# Patient Record
Sex: Female | Born: 1995 | Race: White | Hispanic: No | Marital: Married | Smoking: Former smoker
Health system: Southern US, Community
[De-identification: ages and names within clinical notes are randomized; demographics above are authoritative.]

## PROBLEM LIST (undated history)

## (undated) DIAGNOSIS — Z91018 Allergy to other foods: Secondary | ICD-10-CM

## (undated) HISTORY — PX: CYST REMOVAL HAND: SHX6279

---

## 2019-06-30 ENCOUNTER — Other Ambulatory Visit: Payer: Self-pay

## 2019-06-30 ENCOUNTER — Encounter (HOSPITAL_BASED_OUTPATIENT_CLINIC_OR_DEPARTMENT_OTHER): Payer: Self-pay | Admitting: Emergency Medicine

## 2019-06-30 ENCOUNTER — Emergency Department (HOSPITAL_BASED_OUTPATIENT_CLINIC_OR_DEPARTMENT_OTHER): Payer: BC Managed Care – PPO

## 2019-06-30 ENCOUNTER — Emergency Department (HOSPITAL_BASED_OUTPATIENT_CLINIC_OR_DEPARTMENT_OTHER)
Admission: EM | Admit: 2019-06-30 | Discharge: 2019-06-30 | Disposition: A | Discharge status: Home / Self Care | Payer: BC Managed Care – PPO | Attending: Emergency Medicine | Admitting: Emergency Medicine

## 2019-06-30 DIAGNOSIS — M546 Pain in thoracic spine: Secondary | ICD-10-CM | POA: Diagnosis not present

## 2019-06-30 LAB — D-DIMER, QUANTITATIVE: D-Dimer, Quant: <0.27 ug/mL-FEU (ref 0.00–0.50)

## 2019-06-30 LAB — BASIC METABOLIC PANEL
Anion gap: 8 (ref 5–15)
BUN: 13 mg/dL (ref 6–20)
CO2: 25 mmol/L (ref 22–32)
Calcium: 9.8 mg/dL (ref 8.9–10.3)
Chloride: 104 mmol/L (ref 98–111)
Creatinine, Ser: 0.66 mg/dL (ref 0.44–1.00)
GFR calc Af Amer: >60 mL/min (ref >60)
GFR calc non Af Amer: >60 mL/min (ref >60)
Glucose, Bld: 101 mg/dL — ABNORMAL HIGH (ref 70–99)
Potassium: 4.5 mmol/L (ref 3.5–5.1)
Sodium: 137 mmol/L (ref 135–145)

## 2019-06-30 LAB — HCG, QUANTITATIVE, PREGNANCY: hCG, Beta Chain, Quant, S: <1 m[IU]/mL (ref <5)

## 2019-06-30 MED ORDER — IBUPROFEN 600 MG PO TABS: 600.0000 mg | ORAL_TABLET | Freq: Four times a day (QID) | ORAL | 0 refills | Status: DC | PRN

## 2019-06-30 MED ORDER — IBUPROFEN 400 MG PO TABS
600.0000 mg | ORAL_TABLET | Freq: Once | ORAL | Status: AC
  Administered 2019-06-30: 13:00:00 600 mg via ORAL
  Filled 2019-06-30: qty 1

## 2019-06-30 NOTE — ED Triage Notes (Signed)
Pain between shoulder blades x 30 min

## 2019-06-30 NOTE — ED Notes (Signed)
Patient transported to X-ray 

## 2019-06-30 NOTE — ED Provider Notes (Signed)
[facility] EMERGENCY DEPARTMENT Provider Note   CSN: [id number] Arrival date & time: 06/30/19  1056     History   Chief Complaint Chief Complaint  Patient presents with  . Back Pain    HPI Margaret Jensen is a 23 y.o. female no significant past medical history presents emergency department with back pain.  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  She says she has never had pain like this before.  She reports this pain began approximately 40 minutes prior to arrival in the ED.  Has been constant.  It does not radiate.  She describes it as a 5 out of 10 intensity.  She says it is worse with inspiration.  She also notes it is worse with arm movement.  She denies any pain, nausea, vomiting, lightheadedness.    She does report that she was 4 wheeling in the past 2 days, and wonders whether this may have strained her neck.  She also reports she was lifting heavy weights approximately 2 days ago.  She is otherwise denies any falls or traumas or known accidents.  No hemoptysis or asymmetric LE edema. Patient denies personal or family history of DVT or PE. No recent hormone use (including OCP); travel for >6 hours; prolonged immobilization for greater than 3 days; surgeries or trauma in the last 4 weeks; or malignancy with treatment within 6 months.  She denies any other medical problems.  She denies any known drug allergies.     HPI  History reviewed. No pertinent past medical history.  There are no active problems to display for this patient.   History reviewed. No pertinent surgical history.   OB History   No obstetric history on file.      Home Medications    Prior to Admission medications   Medication Sig Start Date End Date Taking? Authorizing Provider  ibuprofen (ADVIL) 600 MG tablet Take 1 tablet (600 mg total) by  mouth every 6 (six) hours as needed for mild pain or moderate pain. 06/30/19   Terald Sleeper, MD    Family History No family history on file.  Social History Social History   Tobacco Use  . Smoking status: Never Smoker  . Smokeless tobacco: Never Used  Substance Use Topics  . Alcohol use: Yes    Comment: occ  . Drug use: Never     Allergies   Patient has no known allergies.   Review of Systems Review of Systems  Constitutional: Negative for chills and fever.  Eyes: Negative for pain and visual disturbance.  Respiratory: Positive for chest tightness. Negative for cough and shortness of breath.   Cardiovascular: Negative for chest pain and palpitations.  Gastrointestinal: Negative for nausea and vomiting.  Musculoskeletal: Positive for arthralgias, back pain and myalgias. Negative for neck pain and neck stiffness.  Skin: Negative for pallor and rash.  Neurological: Negative for syncope and light-headedness.  Psychiatric/Behavioral: Negative for agitation and confusion.  All other systems reviewed and are negative.    Physical Exam Updated Vital Signs BP 120/74 (BP Location: Right Arm)   Pulse 60   Temp 98.7 F (37.1 C) (Oral)   Resp 16   Ht 5\' 3"  (1.6 m)   Wt 68 kg   LMP 06/14/2019   SpO2 98%   BMI 26.57 kg/m   Physical Exam Vitals signs  and nursing note reviewed.  Constitutional:      General: She is not in acute distress.    Appearance: She is well-developed.  HENT:     Head: Normocephalic and atraumatic.  Eyes:     Conjunctiva/sclera: Conjunctivae normal.  Neck:     Musculoskeletal: Neck supple.  Cardiovascular:     Rate and Rhythm: Regular rhythm.     Pulses: Normal pulses.     Heart sounds: No murmur.     Comments: HR 95-100 bpm Pulmonary:     Effort: Pulmonary effort is normal. No respiratory distress.     Breath sounds: Normal breath sounds.     Comments: 100% on room air Musculoskeletal:        General: No swelling or tenderness.      Comments: Back pain is not reproducible on exam  Skin:    General: Skin is warm and dry.  Neurological:     General: No focal deficit present.     Mental Status: She is alert and oriented to person, place, and time.  Psychiatric:        Mood and Affect: Mood normal.        Behavior: Behavior normal.      ED Treatments / Results  Labs (all labs ordered are listed, but only abnormal results are displayed) Labs Reviewed  BASIC METABOLIC PANEL - Abnormal; Notable for the following components:      Result Value   Glucose, Bld 101 (*)    All other components within normal limits  D-DIMER, QUANTITATIVE (NOT AT [facility])  HCG, QUANTITATIVE, PREGNANCY    EKG None  Radiology Dg Chest 2 View  Result Date: 06/30/2019 CLINICAL DATA:  Pleuritic chest pain. EXAM: CHEST - 2 VIEW COMPARISON:  None. FINDINGS: The heart size and mediastinal contours are within normal limits. Both lungs are clear. No pneumothorax or pleural effusion is noted. The visualized skeletal structures are unremarkable. IMPRESSION: No active cardiopulmonary disease. Electronically Signed   By: Marijo Conception M.D.   On: 06/30/2019 13:23    Procedures Procedures (including critical care time)  Medications Ordered in ED Medications  ibuprofen (ADVIL) tablet 600 mg (600 mg Oral Given 06/30/19 1257)     Initial Impression / Assessment and Plan / ED Course  I have reviewed the triage vital signs and the nursing notes.  Pertinent labs & imaging results that were available during my care of the patient were reviewed by me and considered in my medical decision making (see chart for details).  The patient is a 23 year old female presenting to the emergency department sudden onset pleuritic mid back pain.  This occurred without any prodrome or any clear inciting events.  She is mildly tachycardic here with a heart rate ranging from 95 to just over 100 bpm.  Blood pressure is fine she is satting well on room air.  She reports  that she is extremely anxious and her mother bedside confirms that she gets very anxious around doctors.  She has no other risk factors for PE, however given the borderline tachycardia, given that her symptoms are pleuritic, I discussed obtaining a d-dimer with the patient and her mother.  I get the neck step if this test is positive would be CT angiogram.  They verbalized understanding and wished to proceed with this test.  Otherwise it seems more likely to me that this is a musculoskeletal condition.  It may also be related to cervical or upper thoracic arthropathy or back sprain.  I  have a much lower suspicion for pneumonia, acute coronary syndrome, aortic dissection.  Will obtain a chest x-ray to evaluate for evidence of pneumothorax.  She otherwise appears quite well on exam and comfortable.  Clinical Course as of Jun 29 1812  Fri Jun 30, 2019  1326  FINDINGS: The heart size and mediastinal contours are within normal limits. Both lungs are clear. No pneumothorax or pleural effusion is noted. The visualized skeletal structures are unremarkable.  IMPRESSION: No active cardiopulmonary disease   [MT]    Clinical Course User Index [MT] Terald Sleeperrifan, Kandise Riehle J, MD     Final Clinical Impressions(s) / ED Diagnoses   Final diagnoses:  Acute midline thoracic back pain    ED Discharge Orders         Ordered    ibuprofen (ADVIL) 600 MG tablet  Every 6 hours PRN     06/30/19 1328           Terald Sleeperrifan, Odus Clasby J, MD 06/30/19 1813

## 2019-07-09 ENCOUNTER — Other Ambulatory Visit: Payer: Self-pay

## 2019-07-09 ENCOUNTER — Emergency Department
Admission: EM | Admit: 2019-07-09 | Discharge: 2019-07-09 | Disposition: A | Discharge status: Home / Self Care | Payer: BC Managed Care – PPO | Source: Home / Self Care | Attending: Emergency Medicine | Admitting: Emergency Medicine

## 2019-07-09 ENCOUNTER — Other Ambulatory Visit (HOSPITAL_COMMUNITY)
Admission: RE | Admit: 2019-07-09 | Discharge: 2019-07-09 | Disposition: A | Discharge status: Home / Self Care | Payer: BC Managed Care – PPO | Source: Ambulatory Visit | Attending: Emergency Medicine | Admitting: Emergency Medicine

## 2019-07-09 ENCOUNTER — Encounter: Payer: Self-pay | Admitting: Emergency Medicine

## 2019-07-09 DIAGNOSIS — R300 Dysuria: Secondary | ICD-10-CM

## 2019-07-09 DIAGNOSIS — N898 Other specified noninflammatory disorders of vagina: Secondary | ICD-10-CM | POA: Diagnosis not present

## 2019-07-09 LAB — POCT URINALYSIS DIP (MANUAL ENTRY)
Bilirubin, UA: NEGATIVE
Glucose, UA: NEGATIVE mg/dL
Ketones, POC UA: NEGATIVE mg/dL
Leukocytes, UA: NEGATIVE
Nitrite, UA: NEGATIVE
Protein Ur, POC: NEGATIVE mg/dL
Spec Grav, UA: >=1.03 — AB (ref 1.010–1.025)
Urobilinogen, UA: 0.2 E.U./dL
pH, UA: 7 (ref 5.0–8.0)

## 2019-07-09 LAB — POCT URINE PREGNANCY: Preg Test, Ur: NEGATIVE

## 2019-07-09 NOTE — Discharge Instructions (Signed)
Drink good quantities of fluids. Apply Monistat to the vaginal introitus once a day. Urine culture was done. Discharge sent for evaluation also.  Please check my chart.

## 2019-07-09 NOTE — ED Provider Notes (Addendum)
[facility]    CSN: [id number] Arrival date & time: 07/09/19  0854      History   Chief Complaint Chief Complaint  Patient presents with  . Dysuria    HPI Margaret Jensen is a 23 y.o. female.    Dysuria Associated symptoms: vaginal discharge   Associated symptoms: no flank pain   Patient enters with a chief complaint of 3-day history of itching in the vaginal area associated with some burning in the vaginal area.  She denies any blister formation.  She is denies stable relationship.  And has an IUD for birth control.  She has no history of recurrent urinary tract infections.  She did not get up during the evening to urinate.  History reviewed. No pertinent past medical history.  There are no active problems to display for this patient.   History reviewed. No pertinent surgical history.  OB History   No obstetric history on file.      Home Medications    Prior to Admission medications   Not on File    Family History History reviewed. No pertinent family history.  Social History Social History   Tobacco Use  . Smoking status: Never Smoker  . Smokeless tobacco: Never Used  Substance Use Topics  . Alcohol use: Yes    Comment: occ  . Drug use: Never     Allergies   Patient has no known allergies.   Review of Systems Review of Systems  Constitutional: Negative.   Genitourinary: Positive for dysuria and vaginal discharge. Negative for flank pain, genital sores and urgency.     Physical Exam Triage Vital Signs ED Triage Vitals  Enc Vitals Group     BP 07/09/19 0904 115/71     Pulse Rate 07/09/19 0904 82     Resp --      Temp 07/09/19 0904 98.2 F (36.8 C)     Temp Source 07/09/19 0904 Oral     SpO2 07/09/19 0904 98 %     Weight 07/09/19 0905 165 lb (74.8 kg)     Height 07/09/19 0905 5\' 3"  (1.6 m)     Head Circumference --      Peak Flow --      Pain Score 07/09/19 0915 0     Pain Loc --      Pain Edu? --      Excl. in [place]? --     No data found.  Updated Vital Signs BP 115/71 (BP Location: Left Arm)   Pulse 82   Temp 98.2 F (36.8 C) (Oral)   Ht 5\' 3"  (1.6 m)   Wt 74.8 kg   LMP 06/14/2019 (Approximate)   SpO2 98%   BMI 29.23 kg/m   Visual Acuity Right Eye Distance:   Left Eye Distance:   Bilateral Distance:    Right Eye Near:   Left Eye Near:    Bilateral Near:     Physical Exam Constitutional:      Appearance: Normal appearance.  Pulmonary:     Effort: Pulmonary effort is normal.  Abdominal:     General: Abdomen is flat.     Palpations: Abdomen is soft. There is no mass.     Tenderness: There is no abdominal tenderness. There is no right CVA tenderness or left CVA tenderness.  Neurological:     Mental Status: She is alert.      UC Treatments / Results  Labs (all labs ordered are listed, but only abnormal results are  displayed) Labs Reviewed  POCT URINALYSIS DIP (MANUAL ENTRY) - Abnormal; Notable for the following components:      Result Value   Spec Grav, UA >=1.030 (*)    Blood, UA trace-intact (*)    All other components within normal limits  URINE CULTURE  POCT URINE PREGNANCY  CERVICOVAGINAL ANCILLARY ONLY    EKG   Radiology No results found.  Procedures Procedures (including critical care time)  Medications Ordered in UC Medications - No data to display  Initial Impression / Assessment and Plan / UC Course  I have reviewed the triage vital signs and the nursing notes. Her urine is essentially normal.  We will go ahead and do a pregnancy test to be complete urine culture was done as well as self collect less than sent for BV, trichomonas, and yeast.  She can use some Monistat cream in the interim and she was encouraged to force fluids.  Patient declined to have any STD testing done Pertinent labs & imaging results that were available during my care of the patient were reviewed by me and considered in my medical decision making (see chart for details).      Final  Clinical Impressions(s) / UC Diagnoses   Final diagnoses:  Dysuria  Vaginal discharge     Discharge Instructions     Drink good quantities of fluids. Apply Monistat to the vaginal introitus once a day. Urine culture was done. Discharge sent for evaluation also.  Please check my chart.    ED Prescriptions    None     PDMP not reviewed this encounter.   Collene Gobble, MD 07/09/19 1004    Collene Gobble, MD 07/09/19 1005

## 2019-07-09 NOTE — ED Triage Notes (Signed)
Here with worsening dysuria, freq, urge and itching. Started taking Azo x2 days ago; slight improvement. Denies fever,chills or back pain. Reports vag odor but declined STD testing.

## 2019-07-12 ENCOUNTER — Telehealth: Payer: Self-pay | Admitting: Emergency Medicine

## 2019-07-12 LAB — URINE CULTURE
MICRO NUMBER:: 978467
SPECIMEN QUALITY:: ADEQUATE

## 2019-07-12 LAB — CERVICOVAGINAL ANCILLARY ONLY
Bacterial vaginitis: NEGATIVE
Candida vaginitis: NEGATIVE
Trichomonas: NEGATIVE

## 2019-07-12 NOTE — Telephone Encounter (Signed)
Dr Everlene Farrier called in [place] 100 bid for 3 days, pt was notified

## 2019-08-19 ENCOUNTER — Encounter: Payer: Self-pay | Admitting: Emergency Medicine

## 2019-08-19 ENCOUNTER — Emergency Department
Admission: EM | Admit: 2019-08-19 | Discharge: 2019-08-19 | Disposition: A | Discharge status: Home / Self Care | Payer: BC Managed Care – PPO | Source: Home / Self Care | Attending: Family Medicine | Admitting: Family Medicine

## 2019-08-19 ENCOUNTER — Other Ambulatory Visit: Payer: Self-pay

## 2019-08-19 ENCOUNTER — Emergency Department (INDEPENDENT_AMBULATORY_CARE_PROVIDER_SITE_OTHER): Payer: BC Managed Care – PPO

## 2019-08-19 DIAGNOSIS — M79675 Pain in left toe(s): Secondary | ICD-10-CM

## 2019-08-19 DIAGNOSIS — S90112A Contusion of left great toe without damage to nail, initial encounter: Secondary | ICD-10-CM

## 2019-08-19 NOTE — ED Triage Notes (Signed)
Patient injured left large toe yesterday; bent during work activities; now painful especially upon weight bearing.No OTC today. She has not had influenza vacc this season. She has not been in contact with known covid positive person nor has she travelled during past month,.

## 2019-08-19 NOTE — Discharge Instructions (Addendum)
Apply ice pack for 10 to 15 minutes, 3 to 4 times daily  Continue until pain and swelling decrease.  May take Tylenol or ibuprofen as needed.

## 2019-08-19 NOTE — ED Provider Notes (Signed)
[facility]    CSN: [id number] Arrival date & time: 08/19/19  1352      History   Chief Complaint Chief Complaint  Patient presents with  . Toe Pain    left large    HPI Margaret Jensen is a 23 y.o. female.   Patient tripped over a dog kennel yesterday, injuring her left first toe.  She continues to have pain with weight bearing.   Toe Pain This is a new problem. The current episode started yesterday. The problem occurs constantly. The problem has not changed since onset.The symptoms are aggravated by walking. Nothing relieves the symptoms. She has tried nothing for the symptoms.    History reviewed. No pertinent past medical history.  There are no active problems to display for this patient.   History reviewed. No pertinent surgical history.  OB History   No obstetric history on file.      Home Medications    Prior to Admission medications   Medication Sig Start Date End Date Taking? Authorizing Provider  levonorgestrel (MIRENA) 20 MCG/24HR IUD 1 each by Intrauterine route once.   Yes [provider]    Family History No family history on file.  Social History Social History   Tobacco Use  . Smoking status: Never Smoker  . Smokeless tobacco: Never Used  Substance Use Topics  . Alcohol use: Yes    Comment: occ  . Drug use: Never     Allergies   Patient has no known allergies.   Review of Systems Review of Systems  All other systems reviewed and are negative.    Physical Exam Triage Vital Signs ED Triage Vitals  Enc Vitals Group     BP 08/19/19 1505 115/75     Pulse Rate 08/19/19 1505 71     Resp 08/19/19 1505 16     Temp 08/19/19 1505 98.5 F (36.9 C)     Temp Source 08/19/19 1505 Oral     SpO2 08/19/19 1505 100 %     Weight 08/19/19 1506 163 lb 2.3 oz (74 kg)     Height 08/19/19 1506 5\' 3"  (1.6 m)     Head Circumference --      Peak Flow --      Pain Score 08/19/19 1506 5     Pain Loc --      Pain Edu? --       Excl. in [place]? --    No data found.  Updated Vital Signs BP 115/75 (BP Location: Right Arm)   Pulse 71   Temp 98.5 F (36.9 C) (Oral)   Resp 16   Ht 5\' 3"  (1.6 m)   Wt 74 kg   SpO2 100%   BMI 28.90 kg/m   Visual Acuity Right Eye Distance:   Left Eye Distance:   Bilateral Distance:    Right Eye Near:   Left Eye Near:    Bilateral Near:     Physical Exam Vitals signs and nursing note reviewed.  Constitutional:      General: She is not in acute distress. HENT:     Head: Normocephalic.  Eyes:     Pupils: Pupils are equal, round, and reactive to light.  Cardiovascular:     Rate and Rhythm: Normal rate.  Pulmonary:     Effort: Pulmonary effort is normal.  Musculoskeletal:     Right foot: Decreased range of motion. Normal capillary refill. Tenderness and swelling present. No bony tenderness, crepitus, deformity or laceration.  Feet:     Comments: Left great toe has tenderness and swelling over the plantar surface of the distal phalanx.    Skin:    General: Skin is warm and dry.  Neurological:     Mental Status: She is alert.      UC Treatments / Results  Labs (all labs ordered are listed, but only abnormal results are displayed) Labs Reviewed - No data to display  EKG   Radiology Dg Toe Great Left  Result Date: 08/19/2019 CLINICAL DATA:  Left great toe pain after injury EXAM: LEFT GREAT TOE COMPARISON:  None. FINDINGS: There is no evidence of fracture or dislocation. There is no evidence of arthropathy or other focal bone abnormality. Soft tissues are unremarkable. IMPRESSION: Negative. Electronically Signed   By: Duanne Guess M.D.   On: 08/19/2019 15:24    Procedures Procedures (including critical care time)  Medications Ordered in UC Medications - No data to display  Initial Impression / Assessment and Plan / UC Course  I have reviewed the triage vital signs and the nursing notes.  Pertinent labs & imaging results that were available  during my care of the patient were reviewed by me and considered in my medical decision making (see chart for details).    There is no evidence of fracture. Followup with Family Doctor if not improved in about 10 days.   Final Clinical Impressions(s) / UC Diagnoses   Final diagnoses:  Contusion of left great toe without damage to nail, initial encounter     Discharge Instructions     Apply ice pack for 10 to 15 minutes, 3 to 4 times daily  Continue until pain and swelling decrease.  May take Tylenol or ibuprofen as needed.    ED Prescriptions    None        Lattie Haw, MD 08/21/19 [phone]

## 2019-10-07 ENCOUNTER — Other Ambulatory Visit: Payer: Self-pay

## 2019-10-07 ENCOUNTER — Encounter: Payer: Self-pay | Admitting: Emergency Medicine

## 2019-10-07 ENCOUNTER — Emergency Department
Admission: EM | Admit: 2019-10-07 | Discharge: 2019-10-07 | Disposition: A | Discharge status: Home / Self Care | Payer: BC Managed Care – PPO | Source: Home / Self Care

## 2019-10-07 DIAGNOSIS — R350 Frequency of micturition: Secondary | ICD-10-CM | POA: Diagnosis not present

## 2019-10-07 DIAGNOSIS — N3001 Acute cystitis with hematuria: Secondary | ICD-10-CM

## 2019-10-07 HISTORY — DX: Allergy to other foods: Z91.018

## 2019-10-07 LAB — POCT URINALYSIS DIP (MANUAL ENTRY)
Bilirubin, UA: NEGATIVE
Glucose, UA: NEGATIVE mg/dL
Ketones, POC UA: NEGATIVE mg/dL
Leukocytes, UA: NEGATIVE
Nitrite, UA: POSITIVE — AB
Protein Ur, POC: NEGATIVE mg/dL
Spec Grav, UA: >=1.03 — AB (ref 1.010–1.025)
Urobilinogen, UA: 0.2 E.U./dL
pH, UA: 5.5 (ref 5.0–8.0)

## 2019-10-07 MED ORDER — NITROFURANTOIN MONOHYD MACRO 100 MG PO CAPS: 100.0000 mg | ORAL_CAPSULE | Freq: Two times a day (BID) | ORAL | 0 refills | Status: DC

## 2019-10-07 NOTE — Discharge Instructions (Signed)
Return if symptoms worsen or do not improve. Urine culture pending. If antibiotic require change, you will be notified via phone.

## 2019-10-07 NOTE — ED Provider Notes (Signed)
[facility]    CSN: [id number] Arrival date & time: 10/07/19  1401      History   Chief Complaint Chief Complaint  Patient presents with  . Urinary Frequency    HPI Margaret Jensen is a 24 y.o. female.   HPI   Margaret Jensen is a 24 y.o. female presents for evaluation of urinary frequency, urgency and dysuria x 2 days, without flank pain, fever, chills, nausea/vomiting,  or abnormal vaginal discharge or bleeding. Reports no concern for STD. Previous history of UTI and which respond to Macrobid  Antibiotic therapy. Uncertain of prior urine pathology. No LMP recorded. (Menstrual status: IUD).   Past Medical History:  Diagnosis Date  . Allergy to tree nuts     There are no problems to display for this patient.   History reviewed. No pertinent surgical history.  OB History   No obstetric history on file.      Home Medications    Prior to Admission medications   Medication Sig Start Date End Date Taking? Authorizing Provider  levonorgestrel (MIRENA) 20 MCG/24HR IUD 1 each by Intrauterine route once.    [provider]    Family History No family history on file.  Social History Social History   Tobacco Use  . Smoking status: Never Smoker  . Smokeless tobacco: Never Used  Substance Use Topics  . Alcohol use: Yes    Comment: occ  . Drug use: Never     Allergies   Patient has no known allergies.   Review of Systems Review of Systems Pertinent negatives listed in HPI Physical Exam Triage Vital Signs ED Triage Vitals  Enc Vitals Group     BP 10/07/19 1509 110/68     Pulse Rate 10/07/19 1509 73     Resp 10/07/19 1509 16     Temp 10/07/19 1509 98 F (36.7 C)     Temp Source 10/07/19 1509 Oral     SpO2 10/07/19 1509 100 %     Weight 10/07/19 1510 159 lb (72.1 kg)     Height 10/07/19 1510 5\' 3"  (1.6 m)     Head Circumference --      Peak Flow --      Pain Score 10/07/19 1510 0     Pain Loc --      Pain Edu? --      Excl. in  [place]? --    No data found.  Updated Vital Signs BP 110/68 (BP Location: Right Arm)   Pulse 73   Temp 98 F (36.7 C) (Oral)   Resp 16   Ht 5\' 3"  (1.6 m)   Wt 159 lb (72.1 kg)   SpO2 100%   BMI 28.17 kg/m   Visual Acuity Right Eye Distance:   Left Eye Distance:   Bilateral Distance:    Right Eye Near:   Left Eye Near:    Bilateral Near:    Physical Exam General appearance: alert, well developed, well nourished, cooperative and in no distress Head: Normocephalic, without obvious abnormality, atraumatic Respiratory: Respirations even and unlabored, normal respiratory rate Heart: rate and rhythm normal. No gallop or murmurs noted on exam  Abdomen:-CVA tenderness Extremities: No gross deformities Skin: Skin color, texture, turgor normal. No rashes seen  Psych: Appropriate mood and affect. Neurologic: Alert, oriented to person, place, and time, thought content appropriate.  UC Treatments / Results  Labs (all labs ordered are listed, but only abnormal results are displayed) Labs Reviewed  POCT URINALYSIS DIP (MANUAL  ENTRY) - Abnormal; Notable for the following components:      Result Value   Clarity, UA cloudy (*)    Spec Grav, UA >=1.030 (*)    Blood, UA moderate (*)    Nitrite, UA Positive (*)    All other components within normal limits  URINE CULTURE    EKG   Radiology No results found.  Procedures Procedures (including critical care time)  Medications Ordered in UC Medications - No data to display  Initial Impression / Assessment and Plan / UC Course  I have reviewed the triage vital signs and the nursing notes.  Pertinent labs & imaging results that were available during my care of the patient were reviewed by me and considered in my medical decision making (see chart for details).    Acute urinary tract infection, treating empirically with Macrobid 100 mg BID x 10 day. Red flags discussed. Urine culture pending. Patient verbalized understanding and  agreement with plan. Final Clinical Impressions(s) / UC Diagnoses   Final diagnoses:  Frequency of urination  Acute cystitis with hematuria     Discharge Instructions     Return if symptoms worsen or do not improve. Urine culture pending. If antibiotic require change, you will be notified via phone.    ED Prescriptions    Medication Sig Dispense Auth. Provider   nitrofurantoin, macrocrystal-monohydrate, (MACROBID) 100 MG capsule Take 1 capsule (100 mg total) by mouth 2 (two) times daily. 20 capsule Scot Jun, FNP     PDMP not reviewed this encounter.   Scot Jun, FNP 10/07/19 [phone]

## 2019-10-07 NOTE — ED Triage Notes (Signed)
Patient states she thinks she has a UTI because of the odor, which is typical for her. No OTCs.  Has not had influenza vacc this season. No known contact with covid positive person.

## 2019-10-09 LAB — URINE CULTURE
MICRO NUMBER:: 10026433
SPECIMEN QUALITY:: ADEQUATE

## 2020-01-30 IMAGING — DX DG TOE GREAT 2+V*L*
3 series · 3 of 3 positions shown · non-contrast
Comparison: None.

CLINICAL DATA: Left great toe pain after injury

EXAM:
LEFT GREAT TOE

[toe ap]
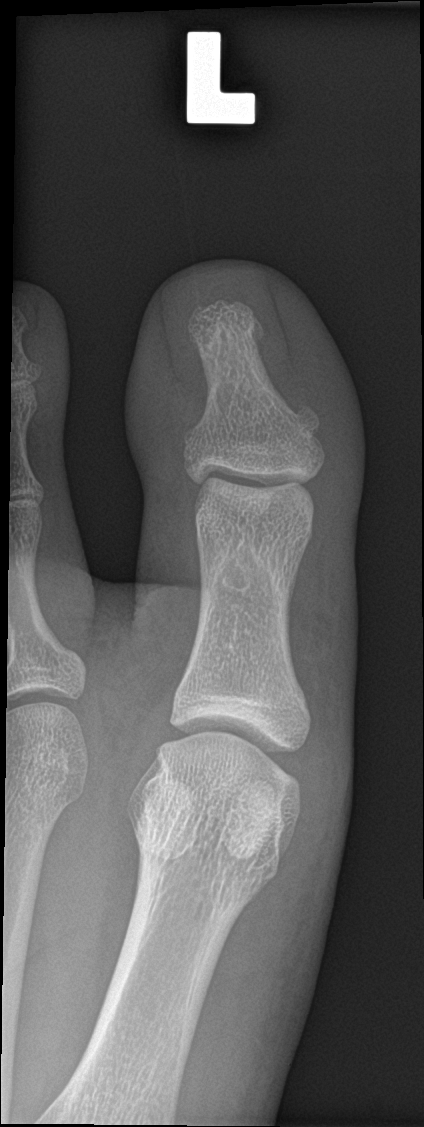

[toe obl]
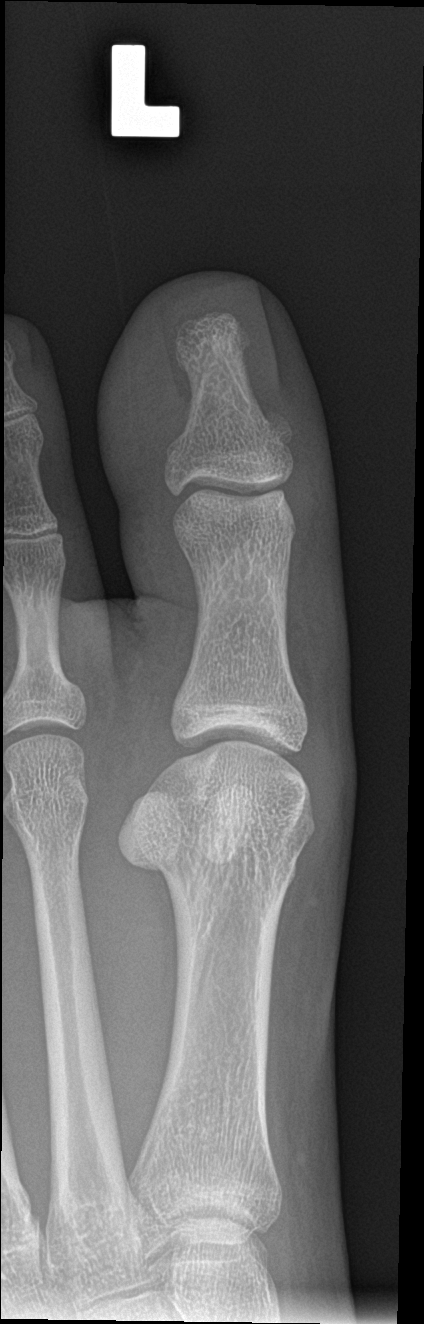

[toe lat]
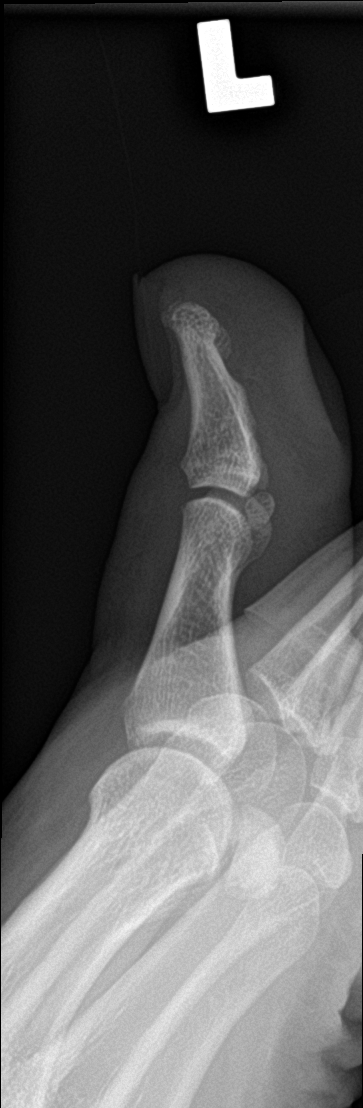

[3 of 3 positions shown; findings below may reference images not displayed]

FINDINGS: There is no evidence of fracture or dislocation. There is no
evidence of arthropathy or other focal bone abnormality. Soft
tissues are unremarkable.
IMPRESSION: Negative.

## 2020-04-13 ENCOUNTER — Emergency Department (INDEPENDENT_AMBULATORY_CARE_PROVIDER_SITE_OTHER)
Admission: RE | Admit: 2020-04-13 | Discharge: 2020-04-13 | Disposition: A | Discharge status: Home / Self Care | Payer: BC Managed Care – PPO | Source: Ambulatory Visit | Attending: Family Medicine | Admitting: Family Medicine

## 2020-04-13 ENCOUNTER — Other Ambulatory Visit: Payer: Self-pay

## 2020-04-13 VITALS — BP 120/77 | HR 81 | Temp 98.3°F | Resp 16 | Ht 62.0 in | Wt 145.0 lb

## 2020-04-13 DIAGNOSIS — Z23 Encounter for immunization: Secondary | ICD-10-CM | POA: Diagnosis not present

## 2020-04-13 DIAGNOSIS — S00262A Insect bite (nonvenomous) of left eyelid and periocular area, initial encounter: Secondary | ICD-10-CM

## 2020-04-13 DIAGNOSIS — W57XXXA Bitten or stung by nonvenomous insect and other nonvenomous arthropods, initial encounter: Secondary | ICD-10-CM | POA: Diagnosis not present

## 2020-04-13 MED ORDER — DOXYCYCLINE HYCLATE 100 MG PO CAPS: ORAL_CAPSULE | ORAL | 0 refills | Status: DC

## 2020-04-13 MED ORDER — TETANUS-DIPHTH-ACELL PERTUSSIS 5-2.5-18.5 LF-MCG/0.5 IM SUSP
0.5000 mL | Freq: Once | INTRAMUSCULAR | Status: AC
  Administered 2020-04-13: 0.5 mL via INTRAMUSCULAR

## 2020-04-13 MED ORDER — METHYLPREDNISOLONE SODIUM SUCC 125 MG IJ SOLR
80.0000 mg | Freq: Once | INTRAMUSCULAR | Status: AC
  Administered 2020-04-13: 80 mg via INTRAMUSCULAR

## 2020-04-13 MED ORDER — PREDNISONE 20 MG PO TABS: ORAL_TABLET | ORAL | 0 refills | Status: DC

## 2020-04-13 NOTE — ED Triage Notes (Signed)
Patient was stung on left ear and right upper eyelid 2 days ago ; has taken benadryl and used ice but now left eyelids very edematous. Has not had covid vaccination.

## 2020-04-13 NOTE — Discharge Instructions (Addendum)
Begin prednisone Sunday 04/14/20. Continue to apply cool compress several times daily.  May take Zyrtec 10mg  daily.

## 2020-04-13 NOTE — ED Provider Notes (Signed)
[facility]    CSN: [id number] Arrival date & time: 04/13/20  1047      History   Chief Complaint Chief Complaint  Patient presents with  . Insect Bite    HPI Margaret Jensen is a 24 y.o. female.   Patient was stung by a wasp on her right ear and left upper eyelid two days ago.  Her left upper eyelid has remained swollen and erythematous, becoming more swollen this morning.  She does not remember her last Tdap.  Has not had covid vaccination.  The history is provided by the patient.    Past Medical History:  Diagnosis Date  . Allergy to tree nuts     There are no problems to display for this patient.   History reviewed. No pertinent surgical history.  OB History   No obstetric history on file.      Home Medications    Prior to Admission medications   Medication Sig Start Date End Date Taking? Authorizing Provider  doxycycline (VIBRAMYCIN) 100 MG capsule Take one cap PO Q12hr with food. 04/13/20   Lattie Haw, MD  levonorgestrel (MIRENA) 20 MCG/24HR IUD 1 each by Intrauterine route once.    [provider]  predniSONE (DELTASONE) 20 MG tablet Take one tab by mouth twice daily for 4 days, then one daily for 3 days. Take with food. 04/13/20   Lattie Haw, MD    Family History No family history on file.  Social History Social History   Tobacco Use  . Smoking status: Never Smoker  . Smokeless tobacco: Never Used  Substance Use Topics  . Alcohol use: Yes    Comment: occ  . Drug use: Never     Allergies   Patient has no known allergies.   Review of Systems Review of Systems  Constitutional: Negative for activity change, chills, diaphoresis, fatigue and fever.  HENT: Negative.   Eyes: Positive for pain and redness. Negative for photophobia, discharge, itching and visual disturbance.  Skin: Positive for color change.  All other systems reviewed and are negative.    Physical Exam Triage Vital Signs ED Triage Vitals    Enc Vitals Group     BP 04/13/20 1116 120/77     Pulse Rate 04/13/20 1116 81     Resp 04/13/20 1116 16     Temp 04/13/20 1116 98.3 F (36.8 C)     Temp Source 04/13/20 1116 Oral     SpO2 04/13/20 1116 100 %     Weight 04/13/20 1117 145 lb (65.8 kg)     Height 04/13/20 1117 5\' 2"  (1.575 m)     Head Circumference --      Peak Flow --      Pain Score 04/13/20 1116 0     Pain Loc --      Pain Edu? --      Excl. in GC? --    No data found.  Updated Vital Signs BP 120/77 (BP Location: Right Arm)   Pulse 81   Temp 98.3 F (36.8 C) (Oral)   Resp 16   Ht 5\' 2"  (1.575 m)   Wt 65.8 kg   SpO2 100%   BMI 26.52 kg/m   Visual Acuity Right Eye Distance:   Left Eye Distance:   Bilateral Distance:    Right Eye Near:   Left Eye Near:    Bilateral Near:     Physical Exam Vitals and nursing note reviewed.  Constitutional:  General: She is not in acute distress.    Appearance: She is not ill-appearing.  HENT:     Head: Normocephalic.     Right Ear: External ear normal.     Left Ear: External ear normal.     Nose: Nose normal.     Mouth/Throat:     Pharynx: Oropharynx is clear.  Eyes:     General:        Left eye: No discharge.     Extraocular Movements: Extraocular movements intact.     Conjunctiva/sclera: Conjunctivae normal.     Left eye: Left conjunctiva is not injected. No chemosis, exudate or hemorrhage.    Pupils: Pupils are equal, round, and reactive to light.      Comments: Left upper and lower eyelids are erythematous and edematous but not tender to palpation.  Cardiovascular:     Rate and Rhythm: Normal rate.  Pulmonary:     Effort: Pulmonary effort is normal.  Musculoskeletal:     Right lower leg: No edema.     Left lower leg: No edema.  Skin:    General: Skin is warm and dry.  Neurological:     Mental Status: She is alert and oriented to person, place, and time.      UC Treatments / Results  Labs (all labs ordered are listed, but only abnormal  results are displayed) Labs Reviewed - No data to display  EKG   Radiology No results found.  Procedures Procedures (including critical care time)  Medications Ordered in UC Medications  methylPREDNISolone sodium succinate (SOLU-MEDROL) 125 mg/2 mL injection 80 mg (has no administration in time range)  Tdap (BOOSTRIX) injection 0.5 mL (0.5 mLs Intramuscular Given 04/13/20 1123)    Initial Impression / Assessment and Plan / UC Course  I have reviewed the triage vital signs and the nursing notes.  Pertinent labs & imaging results that were available during my care of the patient were reviewed by me and considered in my medical decision making (see chart for details).    Administered Tdap  Administered Solumedrol 80mg  IM; then begin prednisone burst/taper. Suspect developing cellulitis.  Begin doxycycline for staph coverage. Followup with ophthalmologist if not improved 3 days.   Final Clinical Impressions(s) / UC Diagnoses   Final diagnoses:  Insect bite of left eyelid, initial encounter     Discharge Instructions     Begin prednisone Sunday 04/14/20. Continue to apply cool compress several times daily.  May take Zyrtec 10mg  daily.    ED Prescriptions    Medication Sig Dispense Auth. Provider   doxycycline (VIBRAMYCIN) 100 MG capsule Take one cap PO Q12hr with food. 14 capsule 04/16/20, MD   predniSONE (DELTASONE) 20 MG tablet Take one tab by mouth twice daily for 4 days, then one daily for 3 days. Take with food. 11 tablet , MD        Lattie Haw, MD 04/15/20 [phone]

## 2020-10-11 ENCOUNTER — Other Ambulatory Visit: Payer: Self-pay

## 2020-10-11 ENCOUNTER — Ambulatory Visit
Admission: RE | Admit: 2020-10-11 | Discharge: 2020-10-11 | Disposition: A | Discharge status: Home / Self Care | Payer: BC Managed Care – PPO | Source: Ambulatory Visit | Attending: Family Medicine | Admitting: Family Medicine

## 2020-10-11 VITALS — BP 131/75 | HR 108 | Temp 99.3°F | Resp 19 | Ht 62.0 in | Wt 170.0 lb

## 2020-10-11 DIAGNOSIS — U071 COVID-19: Secondary | ICD-10-CM | POA: Diagnosis not present

## 2020-10-11 DIAGNOSIS — J029 Acute pharyngitis, unspecified: Secondary | ICD-10-CM | POA: Diagnosis not present

## 2020-10-11 LAB — GROUP A STREP BY PCR: Group A Strep by PCR: NOT DETECTED

## 2020-10-11 MED ORDER — LIDOCAINE VISCOUS HCL 2 % MT SOLN: OROMUCOSAL | 0 refills | Status: AC

## 2020-10-11 NOTE — ED Provider Notes (Signed)
[facility]    CSN: [id number] Arrival date & time: 10/11/20  1845      History   Chief Complaint Chief Complaint  Patient presents with  . Sore Throat   HPI   25 year old female presents with the above complaint.  Started yesterday.  She reports sore throat.  She also reports some facial pain/pressure.  Slightly elevated temperature.  No documented true fever.  Denies headaches to me.  She reported this to nursing staff.  No reported sick contacts other than her boss who has tested negative for COVID.  No relieving factors.  No other associated symptoms.  No other complaints.  Past Medical History:  Diagnosis Date  . Allergy to tree nuts     Home Medications    Prior to Admission medications   Medication Sig Start Date End Date Taking? Authorizing Provider  levonorgestrel (MIRENA) 20 MCG/24HR IUD 1 each by Intrauterine route once.   Yes [provider]  lidocaine (XYLOCAINE) 2 % solution Gargle 15 mL every 3 hours as needed for sore throat. May swallow if desired. 10/11/20  Yes Tommie Sams, DO    Social History Social History   Tobacco Use  . Smoking status: Never Smoker  . Smokeless tobacco: Never Used  Substance Use Topics  . Alcohol use: Yes    Comment: occ  . Drug use: Never     Allergies   Patient has no known allergies.   Review of Systems Review of Systems  Constitutional: Negative for fever.  HENT: Positive for congestion, sinus pressure and sore throat.    Physical Exam Triage Vital Signs ED Triage Vitals  Enc Vitals Group     BP 10/11/20 1913 131/75     Pulse Rate 10/11/20 1913 (!) 108     Resp 10/11/20 1913 19     Temp 10/11/20 1913 99.3 F (37.4 C)     Temp Source 10/11/20 1913 Oral     SpO2 10/11/20 1913 98 %     Weight 10/11/20 1912 170 lb (77.1 kg)     Height 10/11/20 1912 5\' 2"  (1.575 m)     Head Circumference --      Peak Flow --      Pain Score 10/11/20 1911 0     Pain Loc --      Pain Edu? --       Excl. in GC? --    Updated Vital Signs BP 131/75 (BP Location: Left Arm)   Pulse (!) 108   Temp 99.3 F (37.4 C) (Oral)   Resp 19   Ht 5\' 2"  (1.575 m)   Wt 77.1 kg   SpO2 98%   BMI 31.09 kg/m   Visual Acuity Right Eye Distance:   Left Eye Distance:   Bilateral Distance:    Right Eye Near:   Left Eye Near:    Bilateral Near:     Physical Exam Vitals and nursing note reviewed.  Constitutional:      General: She is not in acute distress.    Appearance: Normal appearance. She is not ill-appearing.  HENT:     Head: Normocephalic and atraumatic.     Mouth/Throat:     Pharynx: Oropharynx is clear. No oropharyngeal exudate or posterior oropharyngeal erythema.  Eyes:     General:        Right eye: No discharge.        Left eye: No discharge.     Conjunctiva/sclera: Conjunctivae normal.  Cardiovascular:  Rate and Rhythm: Regular rhythm. Tachycardia present.  Pulmonary:     Effort: Pulmonary effort is normal.     Breath sounds: Normal breath sounds. No wheezing, rhonchi or rales.  Neurological:     Mental Status: She is alert.  Psychiatric:        Mood and Affect: Mood normal.        Behavior: Behavior normal.    UC Treatments / Results  Labs (all labs ordered are listed, but only abnormal results are displayed) Labs Reviewed  GROUP A STREP BY PCR  SARS CORONAVIRUS 2 (TAT 6-24 HRS)    EKG   Radiology No results found.  Procedures Procedures (including critical care time)  Medications Ordered in UC Medications - No data to display  Initial Impression / Assessment and Plan / UC Course  I have reviewed the triage vital signs and the nursing notes.  Pertinent labs & imaging results that were available during my care of the patient were reviewed by me and considered in my medical decision making (see chart for details).    25 year old female presents with respiratory symptoms. Primarily complaint is sore throat.  Strep negative. Awaiting COVID test  results.  Viscous lidocaine as directed.  Supportive care.  Final Clinical Impressions(s) / UC Diagnoses   Final diagnoses:  Acute pharyngitis, unspecified etiology   Discharge Instructions   None    ED Prescriptions    Medication Sig Dispense Auth. Provider   lidocaine (XYLOCAINE) 2 % solution Gargle 15 mL every 3 hours as needed for sore throat. May swallow if desired. 200 mL Tommie Sams, DO     PDMP not reviewed this encounter.   Tommie Sams, [place] 10/11/20 2001

## 2020-10-11 NOTE — ED Triage Notes (Signed)
Patient states that she has been having a sore throat, with nasal congestion, fever, headaches x yesterday.

## 2020-10-12 LAB — SARS CORONAVIRUS 2 (TAT 6-24 HRS): SARS Coronavirus 2: POSITIVE — AB

## 2021-07-15 ENCOUNTER — Other Ambulatory Visit: Payer: Self-pay

## 2021-07-15 ENCOUNTER — Emergency Department
Admission: RE | Admit: 2021-07-15 | Discharge: 2021-07-15 | Disposition: A | Discharge status: Home / Self Care | Payer: Self-pay | Source: Ambulatory Visit

## 2021-07-15 VITALS — BP 110/76 | HR 72 | Temp 98.7°F | Resp 16 | Ht 63.0 in | Wt 179.0 lb

## 2021-07-15 DIAGNOSIS — R300 Dysuria: Secondary | ICD-10-CM

## 2021-07-15 DIAGNOSIS — N3001 Acute cystitis with hematuria: Secondary | ICD-10-CM

## 2021-07-15 LAB — POCT URINALYSIS DIP (MANUAL ENTRY)
Bilirubin, UA: NEGATIVE
Glucose, UA: NEGATIVE mg/dL
Ketones, POC UA: NEGATIVE mg/dL
Leukocytes, UA: NEGATIVE
Nitrite, UA: POSITIVE — AB
Protein Ur, POC: NEGATIVE mg/dL
Spec Grav, UA: 1.015 (ref 1.010–1.025)
Urobilinogen, UA: 0.2 E.U./dL
pH, UA: 6 (ref 5.0–8.0)

## 2021-07-15 MED ORDER — NITROFURANTOIN MONOHYD MACRO 100 MG PO CAPS: 100.0000 mg | ORAL_CAPSULE | Freq: Two times a day (BID) | ORAL | 0 refills | Status: AC

## 2021-07-15 NOTE — ED Provider Notes (Signed)
[facility]    CSN: [id number] Arrival date & time: 07/15/21  1835      History   Chief Complaint Chief Complaint  Patient presents with   Urinary Frequency   Dysuria    HPI Margaret Jensen is a 25 y.o. female.   HPI 25 year old female presents with urinary frequency/urgency and mild dysuria for 4 days.  History reviewed. No pertinent past medical history.  There are no problems to display for this patient.   Past Surgical History:  Procedure Laterality Date   CYST REMOVAL HAND Left    Wrist    OB History   No obstetric history on file.      Home Medications    Prior to Admission medications   Medication Sig Start Date End Date Taking? Authorizing Provider  nitrofurantoin, macrocrystal-monohydrate, (MACROBID) 100 MG capsule Take 1 capsule (100 mg total) by mouth 2 (two) times daily for 7 days. 07/15/21 07/22/21 Yes Trevor Iha, FNP  phenazopyridine (PYRIDIUM) 95 MG tablet Take 95 mg by mouth 3 (three) times daily as needed for pain.   Yes [provider]    Family History Family History  Problem Relation Age of Onset   Cancer Mother    Healthy Father     Social History Social History   Tobacco Use   Smoking status: Former    Years: 5.00    Types: Cigarettes    Quit date: 07/09/2020    Years since quitting: 1.0   Smokeless tobacco: Never  Vaping Use   Vaping Use: Never used  Substance Use Topics   Alcohol use: Yes    Alcohol/week: 4.0 standard drinks    Types: 4 Standard drinks or equivalent per week    Comment: weekly   Drug use: Not Currently     Allergies   Other   Review of Systems Review of Systems  Genitourinary:  Positive for dysuria, frequency and urgency.  All other systems reviewed and are negative.   Physical Exam Triage Vital Signs ED Triage Vitals [07/15/21 1851]  Enc Vitals Group     BP      Pulse      Resp      Temp      Temp src      SpO2      Weight 179 lb (81.2 kg)     Height 5\' 3"   (1.6 m)     Head Circumference      Peak Flow      Pain Score 0     Pain Loc      Pain Edu?      Excl. in GC?    No data found.  Updated Vital Signs BP 110/76 (BP Location: Right Arm)   Pulse 72   Temp 98.7 F (37.1 C) (Oral)   Resp 16   Ht 5\' 3"  (1.6 m)   Wt 179 lb (81.2 kg)   LMP 07/10/2021   SpO2 96%   BMI 31.71 kg/m   Physical Exam Vitals and nursing note reviewed.  Constitutional:      General: She is not in acute distress.    Appearance: Normal appearance. She is obese. She is not ill-appearing.  HENT:     Head: Normocephalic and atraumatic.     Mouth/Throat:     Mouth: Mucous membranes are moist.     Pharynx: Oropharynx is clear.  Eyes:     Extraocular Movements: Extraocular movements intact.     Conjunctiva/sclera: Conjunctivae normal.  Pupils: Pupils are equal, round, and reactive to light.  Cardiovascular:     Rate and Rhythm: Normal rate and regular rhythm.     Pulses: Normal pulses.     Heart sounds: Normal heart sounds.  Pulmonary:     Effort: Pulmonary effort is normal.     Breath sounds: Normal breath sounds.  Abdominal:     Tenderness: There is no right CVA tenderness or left CVA tenderness.  Musculoskeletal:        General: Normal range of motion.     Cervical back: Normal range of motion and neck supple.  Skin:    General: Skin is warm and dry.  Neurological:     General: No focal deficit present.     Mental Status: She is alert and oriented to person, place, and time. Mental status is at baseline.  Psychiatric:        Mood and Affect: Mood normal.        Behavior: Behavior normal.        Thought Content: Thought content normal.     UC Treatments / Results  Labs (all labs ordered are listed, but only abnormal results are displayed) Labs Reviewed  POCT URINALYSIS DIP (MANUAL ENTRY) - Abnormal; Notable for the following components:      Result Value   Blood, UA trace-lysed (*)    Nitrite, UA Positive (*)    All other components  within normal limits  URINE CULTURE    EKG   Radiology No results found.  Procedures Procedures (including critical care time)  Medications Ordered in UC Medications - No data to display  Initial Impression / Assessment and Plan / UC Course  I have reviewed the triage vital signs and the nursing notes.  Pertinent labs & imaging results that were available during my care of the patient were reviewed by me and considered in my medical decision making (see chart for details).     MDM: 1.  Dysuria-UA reveals (above), urine culture ordered; 2.  Acute cystitis with hematuria-Rx'd Macrobid. Advised/instructed patient to take medication as directed with food to completion.  Encouraged patient increase daily water intake while taking this medication.  Advised patient we will follow-up with urine culture results once received.  Discharged home, hemodynamically stable. Final Clinical Impressions(s) / UC Diagnoses   Final diagnoses:  Dysuria  Acute cystitis with hematuria     Discharge Instructions      Advised/instructed patient to take medication as directed with food to completion.  Encouraged patient increase daily water intake while taking this medication.  Advised patient we will follow-up with urine culture results once received.     ED Prescriptions     Medication Sig Dispense Auth. Provider   nitrofurantoin, macrocrystal-monohydrate, (MACROBID) 100 MG capsule Take 1 capsule (100 mg total) by mouth 2 (two) times daily for 7 days. 14 capsule Trevor Iha, FNP      PDMP not reviewed this encounter.   Trevor Iha, FNP 07/15/21 1914

## 2021-07-15 NOTE — Discharge Instructions (Addendum)
Advised/instructed patient to take medication as directed with food to completion.  Encouraged patient increase daily water intake while taking this medication.  Advised patient we will follow-up with urine culture results once received.

## 2021-07-15 NOTE — ED Triage Notes (Signed)
Pt presents to Urgent Care with c/o urinary frequency, urgency, mild dysuria, and cloudy urine x 4 days.

## 2021-07-18 LAB — URINE CULTURE
MICRO NUMBER:: 12521016
SPECIMEN QUALITY:: ADEQUATE

## 2022-05-11 ENCOUNTER — Ambulatory Visit
Admission: RE | Admit: 2022-05-11 | Discharge: 2022-05-11 | Disposition: A | Discharge status: Home / Self Care | Payer: BC Managed Care – PPO | Source: Ambulatory Visit | Attending: Family Medicine | Admitting: Family Medicine

## 2022-05-11 VITALS — BP 123/70 | HR 107 | Temp 98.6°F | Resp 18 | Ht 62.0 in | Wt 203.0 lb

## 2022-05-11 DIAGNOSIS — J069 Acute upper respiratory infection, unspecified: Secondary | ICD-10-CM | POA: Diagnosis not present

## 2022-05-11 LAB — POC SARS CORONAVIRUS 2 AG -  ED: SARS Coronavirus 2 Ag: NEGATIVE

## 2022-05-11 NOTE — Discharge Instructions (Signed)
Take plain guaifenesin (1200mg  extended release tabs such as Mucinex) twice daily, with plenty of water, for cough and congestion. Get adequate rest.   Recommend using saline nasal spray several times daily and saline nasal irrigation (AYR is a common brand).    May take Delsym Cough Suppressant ("12 Hour Cough Relief") at bedtime for nighttime cough.  Try warm salt water gargles for sore throat.  Stop all antihistamines for now, and other non-prescription cough/cold preparations. May take Tylenol as needed for pain, headache, etc.  Recommend repeating a home COVID test tomorrowl

## 2022-05-11 NOTE — ED Triage Notes (Signed)
Patient c/o body aches, sore throat, head congestion, nasal congestion, nasal drainage, afebrile x 2 days. Patient has taken Mucinex.

## 2022-05-11 NOTE — ED Provider Notes (Signed)
[facility]    CSN: [id number] Arrival date & time: 05/11/22  1500      History   Chief Complaint Chief Complaint  Patient presents with   Nasal Congestion    Congestion, headache, body ache, sore throat. - Entered by patient   Appointment    HPI Margaret Jensen is a 26 y.o. female.   Patient complains of two day history of typical cold-like symptoms including mild sore throat, sinus congestion, headache, fatigue, and cough.  She denies fever, pleuritic pain, and shortness of breath.  She is now at [redacted] weeks gestation.  She denies abdominal pain or vaginal bleeding.   The history is provided by the patient.    Past Medical History:  Diagnosis Date   Allergy to tree nuts     There are no problems to display for this patient.   Past Surgical History:  Procedure Laterality Date   CYST REMOVAL HAND Left    Wrist    OB History     Gravida  1   Para      Term      Preterm      AB      Living         SAB      IAB      Ectopic      Multiple      Live Births               Home Medications    Prior to Admission medications   Medication Sig Start Date End Date Taking? Authorizing Provider  Docusate Sodium (COLACE PO) Take by mouth.   Yes [provider]  Polyethylene Glycol 3350 (MIRALAX PO) Take by mouth.   Yes [provider]  Prenatal Vit-Fe Fumarate-FA (PRENATAL MULTIVITAMIN) TABS tablet Take 1 tablet by mouth daily at 12 noon.   Yes [provider]  lidocaine (XYLOCAINE) 2 % solution Gargle 15 mL every 3 hours as needed for sore throat. May swallow if desired. 10/11/20   Tommie Sams, DO  phenazopyridine (PYRIDIUM) 95 MG tablet Take 95 mg by mouth 3 (three) times daily as needed for pain.    [provider]    Family History Family History  Problem Relation Age of Onset   Cancer Mother    Healthy Father     Social History Social History   Tobacco Use   Smoking status: Former     Years: 5.00    Types: Cigarettes    Quit date: 07/09/2020    Years since quitting: 1.8   Smokeless tobacco: Never  Vaping Use   Vaping Use: Never used  Substance Use Topics   Alcohol use: Not Currently    Alcohol/week: 4.0 standard drinks of alcohol    Types: 4 Standard drinks or equivalent per week    Comment: weekly   Drug use: Not Currently     Allergies   Other   Review of Systems Review of Systems + sore throat + cough No pleuritic pain No wheezing + nasal congestion + post-nasal drainage No sinus pain/pressure No itchy/red eyes No earache No hemoptysis No SOB No fever No nausea No vomiting No abdominal pain No diarrhea No urinary symptoms No skin rash + fatigue + myalgias + headache Used OTC meds (Mucinex Sinus) without relief   Physical Exam Triage Vital Signs ED Triage Vitals  Enc Vitals Group     BP 05/11/22 1518 123/70     Pulse Rate 05/11/22 1518 (!)  107     Resp 05/11/22 1518 18     Temp 05/11/22 1518 98.6 F (37 C)     Temp Source 05/11/22 1518 Oral     SpO2 05/11/22 1518 93 %     Weight 05/11/22 1520 203 lb (92.1 kg)     Height 05/11/22 1520 5\' 2"  (1.575 m)     Head Circumference --      Peak Flow --      Pain Score 05/11/22 1520 8     Pain Loc --      Pain Edu? --      Excl. in GC? --    No data found.  Updated Vital Signs BP 123/70 (BP Location: Right Arm)   Pulse (!) 107   Temp 98.6 F (37 C) (Oral)   Resp 18   Ht 5\' 2"  (1.575 m)   Wt 92.1 kg   LMP 07/10/2021   SpO2 93%   BMI 37.13 kg/m   Visual Acuity Right Eye Distance:   Left Eye Distance:   Bilateral Distance:    Right Eye Near:   Left Eye Near:    Bilateral Near:     Physical Exam Nursing notes and Vital Signs reviewed. Appearance:  Patient appears stated age, and in no acute distress Eyes:  Pupils are equal, round, and reactive to light and accomodation.  Extraocular movement is intact.  Conjunctivae are not inflamed  Ears:  Canals normal.  Tympanic  membranes normal.  Nose:  Mildly congested turbinates.  No sinus tenderness.   Pharynx:  Normal Neck:  Supple.  Mildly enlarged lateral nodes are present, tender to palpation on the left.   Lungs:  Clear to auscultation.  Breath sounds are equal.  Moving air well. Heart:  Regular rate and rhythm without murmurs, rubs, or gallops.  Abdomen:  Gravid. Nontender without masses or hepatosplenomegaly.  Bowel sounds are present.  No CVA or flank tenderness.  Extremities:  No edema.  Skin:  No rash present.   UC Treatments / Results  Labs (all labs ordered are listed, but only abnormal results are displayed) Labs Reviewed  POC SARS CORONAVIRUS 2 AG -  ED negative    EKG   Radiology No results found.  Procedures Procedures (including critical care time)  Medications Ordered in UC Medications - No data to display  Initial Impression / Assessment and Plan / UC Course  I have reviewed the triage vital signs and the nursing notes.  Pertinent labs & imaging results that were available during my care of the patient were reviewed by me and considered in my medical decision making (see chart for details).    Benign exam.  There is no evidence of bacterial infection today.  Treat symptomatically for now  Followup with Family Doctor if not improved in one week.   Final Clinical Impressions(s) / UC Diagnoses   Final diagnoses:  Viral URI with cough     Discharge Instructions      Take plain guaifenesin (1200mg  extended release tabs such as Mucinex) twice daily, with plenty of water, for cough and congestion. Get adequate rest.   Recommend using saline nasal spray several times daily and saline nasal irrigation (AYR is a common brand).    May take Delsym Cough Suppressant ("12 Hour Cough Relief") at bedtime for nighttime cough.  Try warm salt water gargles for sore throat.  Stop all antihistamines for now, and other non-prescription cough/cold preparations. May take Tylenol as needed  for pain, headache,  etc.  Recommend repeating a home COVID test tomorrowl    ED Prescriptions   None       Lattie Haw, MD 05/13/22 2012

## 2022-05-12 ENCOUNTER — Telehealth: Payer: Self-pay | Admitting: Emergency Medicine

## 2022-05-13 DIAGNOSIS — O1213 Gestational proteinuria, third trimester: Secondary | ICD-10-CM | POA: Diagnosis not present

## 2022-05-13 DIAGNOSIS — O24410 Gestational diabetes mellitus in pregnancy, diet controlled: Secondary | ICD-10-CM | POA: Diagnosis not present

## 2022-05-13 DIAGNOSIS — O98513 Other viral diseases complicating pregnancy, third trimester: Secondary | ICD-10-CM | POA: Diagnosis not present

## 2022-05-13 DIAGNOSIS — R42 Dizziness and giddiness: Secondary | ICD-10-CM | POA: Diagnosis not present

## 2022-05-13 DIAGNOSIS — J069 Acute upper respiratory infection, unspecified: Secondary | ICD-10-CM | POA: Diagnosis not present

## 2022-05-13 DIAGNOSIS — O99513 Diseases of the respiratory system complicating pregnancy, third trimester: Secondary | ICD-10-CM | POA: Diagnosis not present

## 2022-05-13 DIAGNOSIS — Z3A35 35 weeks gestation of pregnancy: Secondary | ICD-10-CM | POA: Diagnosis not present

## 2022-05-13 DIAGNOSIS — Z20822 Contact with and (suspected) exposure to covid-19: Secondary | ICD-10-CM | POA: Diagnosis not present

## 2022-05-13 DIAGNOSIS — O24419 Gestational diabetes mellitus in pregnancy, unspecified control: Secondary | ICD-10-CM | POA: Diagnosis not present

## 2022-05-15 DIAGNOSIS — O4393 Unspecified placental disorder, third trimester: Secondary | ICD-10-CM | POA: Diagnosis not present

## 2022-05-15 DIAGNOSIS — O24410 Gestational diabetes mellitus in pregnancy, diet controlled: Secondary | ICD-10-CM | POA: Diagnosis not present

## 2022-05-15 DIAGNOSIS — O3663X0 Maternal care for excessive fetal growth, third trimester, not applicable or unspecified: Secondary | ICD-10-CM | POA: Diagnosis not present

## 2022-05-15 DIAGNOSIS — O99213 Obesity complicating pregnancy, third trimester: Secondary | ICD-10-CM | POA: Diagnosis not present

## 2022-05-19 DIAGNOSIS — Z3400 Encounter for supervision of normal first pregnancy, unspecified trimester: Secondary | ICD-10-CM | POA: Diagnosis not present

## 2022-05-21 DIAGNOSIS — O3663X0 Maternal care for excessive fetal growth, third trimester, not applicable or unspecified: Secondary | ICD-10-CM | POA: Diagnosis not present

## 2022-05-21 DIAGNOSIS — O283 Abnormal ultrasonic finding on antenatal screening of mother: Secondary | ICD-10-CM | POA: Diagnosis not present

## 2022-05-21 DIAGNOSIS — O24410 Gestational diabetes mellitus in pregnancy, diet controlled: Secondary | ICD-10-CM | POA: Diagnosis not present

## 2022-05-21 DIAGNOSIS — O99213 Obesity complicating pregnancy, third trimester: Secondary | ICD-10-CM | POA: Diagnosis not present

## 2022-05-28 DIAGNOSIS — O24410 Gestational diabetes mellitus in pregnancy, diet controlled: Secondary | ICD-10-CM | POA: Diagnosis not present

## 2022-05-28 DIAGNOSIS — O3663X0 Maternal care for excessive fetal growth, third trimester, not applicable or unspecified: Secondary | ICD-10-CM | POA: Diagnosis not present

## 2022-05-28 DIAGNOSIS — Z3A38 38 weeks gestation of pregnancy: Secondary | ICD-10-CM | POA: Diagnosis not present

## 2022-05-28 DIAGNOSIS — O99213 Obesity complicating pregnancy, third trimester: Secondary | ICD-10-CM | POA: Diagnosis not present

## 2022-06-04 DIAGNOSIS — O3663X0 Maternal care for excessive fetal growth, third trimester, not applicable or unspecified: Secondary | ICD-10-CM | POA: Diagnosis not present

## 2022-06-04 DIAGNOSIS — Z91018 Allergy to other foods: Secondary | ICD-10-CM | POA: Diagnosis not present

## 2022-06-04 DIAGNOSIS — O24410 Gestational diabetes mellitus in pregnancy, diet controlled: Secondary | ICD-10-CM | POA: Diagnosis not present

## 2022-06-04 DIAGNOSIS — Z87891 Personal history of nicotine dependence: Secondary | ICD-10-CM | POA: Diagnosis not present

## 2022-06-04 DIAGNOSIS — O328XX0 Maternal care for other malpresentation of fetus, not applicable or unspecified: Secondary | ICD-10-CM | POA: Diagnosis not present

## 2022-06-04 DIAGNOSIS — O24420 Gestational diabetes mellitus in childbirth, diet controlled: Secondary | ICD-10-CM | POA: Diagnosis not present

## 2022-06-04 DIAGNOSIS — Z888 Allergy status to other drugs, medicaments and biological substances status: Secondary | ICD-10-CM | POA: Diagnosis not present

## 2022-06-04 DIAGNOSIS — Z3A39 39 weeks gestation of pregnancy: Secondary | ICD-10-CM | POA: Diagnosis not present

## 2022-06-04 DIAGNOSIS — Z28310 Unvaccinated for covid-19: Secondary | ICD-10-CM | POA: Diagnosis not present

## 2022-06-04 DIAGNOSIS — Z3403 Encounter for supervision of normal first pregnancy, third trimester: Secondary | ICD-10-CM | POA: Diagnosis not present

## 2022-06-04 DIAGNOSIS — Z79899 Other long term (current) drug therapy: Secondary | ICD-10-CM | POA: Diagnosis not present

## 2022-06-04 DIAGNOSIS — O321XX0 Maternal care for breech presentation, not applicable or unspecified: Secondary | ICD-10-CM | POA: Diagnosis not present

## 2022-06-04 DIAGNOSIS — O99214 Obesity complicating childbirth: Secondary | ICD-10-CM | POA: Diagnosis not present

## 2022-07-07 DIAGNOSIS — R350 Frequency of micturition: Secondary | ICD-10-CM | POA: Diagnosis not present

## 2022-07-07 DIAGNOSIS — N3000 Acute cystitis without hematuria: Secondary | ICD-10-CM | POA: Diagnosis not present

## 2022-08-18 DIAGNOSIS — Z01812 Encounter for preprocedural laboratory examination: Secondary | ICD-10-CM | POA: Diagnosis not present

## 2022-08-18 DIAGNOSIS — Z30430 Encounter for insertion of intrauterine contraceptive device: Secondary | ICD-10-CM | POA: Diagnosis not present

## 2022-08-25 DIAGNOSIS — O24410 Gestational diabetes mellitus in pregnancy, diet controlled: Secondary | ICD-10-CM | POA: Diagnosis not present

## 2022-09-01 DIAGNOSIS — L659 Nonscarring hair loss, unspecified: Secondary | ICD-10-CM | POA: Diagnosis not present

## 2022-09-01 DIAGNOSIS — Z0000 Encounter for general adult medical examination without abnormal findings: Secondary | ICD-10-CM | POA: Diagnosis not present

## 2022-09-01 DIAGNOSIS — Z8632 Personal history of gestational diabetes: Secondary | ICD-10-CM | POA: Diagnosis not present

## 2022-09-01 DIAGNOSIS — Z13220 Encounter for screening for lipoid disorders: Secondary | ICD-10-CM | POA: Diagnosis not present

## 2022-09-11 ENCOUNTER — Ambulatory Visit
Admission: EM | Admit: 2022-09-11 | Discharge: 2022-09-11 | Disposition: A | Discharge status: Home / Self Care | Payer: BC Managed Care – PPO | Source: Ambulatory Visit

## 2022-09-11 VITALS — BP 109/70 | HR 78 | Temp 99.2°F | Resp 14 | Ht 62.0 in | Wt 185.0 lb

## 2022-09-11 DIAGNOSIS — N3001 Acute cystitis with hematuria: Secondary | ICD-10-CM

## 2022-09-11 LAB — POCT URINALYSIS DIP (MANUAL ENTRY)
Glucose, UA: 100 mg/dL — AB
Ketones, POC UA: NEGATIVE mg/dL
Leukocytes, UA: NEGATIVE
Nitrite, UA: POSITIVE — AB
Protein Ur, POC: 100 mg/dL — AB
Spec Grav, UA: 1.015 (ref 1.010–1.025)
Urobilinogen, UA: 2 E.U./dL — AB
pH, UA: 5 (ref 5.0–8.0)

## 2022-09-11 MED ORDER — SULFAMETHOXAZOLE-TRIMETHOPRIM 800-160 MG PO TABS: 1.0000 | ORAL_TABLET | Freq: Two times a day (BID) | ORAL | 0 refills | Status: AC

## 2022-09-11 NOTE — ED Notes (Signed)
Pt educated about not taking azo prior to being seen for a U/A due to AZO disrupting the specimen - okay to take after U/A sample obtained

## 2022-09-11 NOTE — ED Provider Notes (Signed)
[facility]    CSN: [id number] Arrival date & time: 09/11/22  1735      History   Chief Complaint Chief Complaint  Patient presents with   Urinary Frequency    HPI Margaret Jensen is a 26 y.o. female.   HPI 26 year old female presents with urine frequency and pressure for 24 hours.  Reports history of UTI and took Azo at noon today.  PMH significant for obesity.  Past Medical History:  Diagnosis Date   Allergy to tree nuts     There are no problems to display for this patient.   Past Surgical History:  Procedure Laterality Date   CESAREAN SECTION     CYST REMOVAL HAND Left    Wrist    OB History     Gravida  2   Para  1   Term      Preterm      AB      Living  1      SAB      IAB      Ectopic      Multiple      Live Births               Home Medications    Prior to Admission medications   Medication Sig Start Date End Date Taking? Authorizing Provider  levonorgestrel (MIRENA) 20 MCG/DAY IUD 1 each by Intrauterine route once.   Yes [provider]  sulfamethoxazole-trimethoprim (BACTRIM DS) 800-160 MG tablet Take 1 tablet by mouth 2 (two) times daily for 7 days. 09/11/22 09/18/22 Yes Trevor Iha, FNP  Docusate Sodium (COLACE PO) Take by mouth. Patient not taking: Reported on 09/11/2022    [provider]  lidocaine (XYLOCAINE) 2 % solution Gargle 15 mL every 3 hours as needed for sore throat. May swallow if desired. Patient not taking: Reported on 09/11/2022 10/11/20   Tommie Sams, DO  phenazopyridine (PYRIDIUM) 95 MG tablet Take 95 mg by mouth 3 (three) times daily as needed for pain. Patient not taking: Reported on 09/11/2022    [provider]  Polyethylene Glycol 3350 (MIRALAX PO) Take by mouth. Patient not taking: Reported on 09/11/2022    [provider]  Prenatal Vit-Fe Fumarate-FA (PRENATAL MULTIVITAMIN) TABS tablet Take 1 tablet by mouth daily at 12 noon. Patient not taking:  Reported on 09/11/2022    [provider]    Family History Family History  Problem Relation Age of Onset   Cancer Mother    Healthy Father     Social History Social History   Tobacco Use   Smoking status: Former    Years: 5.00    Types: Cigarettes    Quit date: 07/09/2020    Years since quitting: 2.1   Smokeless tobacco: Never  Vaping Use   Vaping Use: Never used  Substance Use Topics   Alcohol use: Yes    Alcohol/week: 2.0 standard drinks of alcohol    Types: 2 Standard drinks or equivalent per week    Comment: weekly   Drug use: Not Currently     Allergies   Other   Review of Systems Review of Systems   Physical Exam Triage Vital Signs ED Triage Vitals  Enc Vitals Group     BP 09/11/22 1807 109/70     Pulse Rate 09/11/22 1807 78     Resp 09/11/22 1807 14     Temp 09/11/22 1807 99.2 F (37.3 C)     Temp Source 09/11/22  1807 Oral     SpO2 09/11/22 1807 99 %     Weight 09/11/22 1810 185 lb (83.9 kg)     Height 09/11/22 1810 5\' 2"  (1.575 m)     Head Circumference --      Peak Flow --      Pain Score 09/11/22 1809 3     Pain Loc --      Pain Edu? --      Excl. in GC? --    No data found.  Updated Vital Signs BP 109/70 (BP Location: Right Arm)   Pulse 78   Temp 99.2 F (37.3 C) (Oral)   Resp 14   Ht 5\' 2"  (1.575 m)   Wt 185 lb (83.9 kg)   SpO2 99%   Breastfeeding No   BMI 33.84 kg/m   Visual Acuity Right Eye Distance:   Left Eye Distance:   Bilateral Distance:    Right Eye Near:   Left Eye Near:    Bilateral Near:     Physical Exam Vitals and nursing note reviewed.  Constitutional:      General: She is not in acute distress.    Appearance: She is obese.  HENT:     Head: Normocephalic and atraumatic.     Mouth/Throat:     Mouth: Mucous membranes are moist.     Pharynx: Oropharynx is clear.  Eyes:     Extraocular Movements: Extraocular movements intact.     Conjunctiva/sclera: Conjunctivae normal.     Pupils: Pupils  are equal, round, and reactive to light.  Cardiovascular:     Rate and Rhythm: Normal rate and regular rhythm.     Pulses: Normal pulses.     Heart sounds: Normal heart sounds. No murmur heard. Pulmonary:     Effort: Pulmonary effort is normal.     Breath sounds: Normal breath sounds. No wheezing, rhonchi or rales.  Musculoskeletal:        General: Normal range of motion.     Cervical back: Normal range of motion and neck supple.  Skin:    General: Skin is warm and dry.  Neurological:     General: No focal deficit present.     Mental Status: She is alert and oriented to person, place, and time. Mental status is at baseline.      UC Treatments / Results  Labs (all labs ordered are listed, but only abnormal results are displayed) Labs Reviewed  POCT URINALYSIS DIP (MANUAL ENTRY) - Abnormal; Notable for the following components:      Result Value   Color, UA orange (*)    Clarity, UA turbid (*)    Glucose, UA =100 (*)    Bilirubin, UA small (*)    Blood, UA large (*)    Protein Ur, POC =100 (*)    Urobilinogen, UA 2.0 (*)    Nitrite, UA Positive (*)    All other components within normal limits  URINE CULTURE    EKG   Radiology No results found.  Procedures Procedures (including critical care time)  Medications Ordered in UC Medications - No data to display  Initial Impression / Assessment and Plan / UC Course  I have reviewed the triage vital signs and the nursing notes.  Pertinent labs & imaging results that were available during my care of the patient were reviewed by me and considered in my medical decision making (see chart for details).     MDM: 1.  Acute cystitis with hematuria-Rx'd Bactrim,  urine culture ordered. Patient to take medication as directed with food to completion.  Encouraged patient to increase daily water intake to 64 ounces per day while taking this medication.  Advised if symptoms worsen and/or unresolved please follow-up with PCP or  urology, or or here for further evaluation.  Patient discharged home, hemodynamically stable.  Final Clinical Impressions(s) / UC Diagnoses   Final diagnoses:  Acute cystitis with hematuria     Discharge Instructions      Patient to take medication as directed with food to completion.  Encouraged patient to increase daily water intake to 64 ounces per day while taking this medication.  Advised if symptoms worsen and/or unresolved please follow-up with PCP or urology, or or here for further evaluation.     ED Prescriptions     Medication Sig Dispense Auth. Provider   sulfamethoxazole-trimethoprim (BACTRIM DS) 800-160 MG tablet Take 1 tablet by mouth 2 (two) times daily for 7 days. 14 tablet Trevor Iha, FNP      PDMP not reviewed this encounter.   Trevor Iha, FNP 09/11/22 1904

## 2022-09-11 NOTE — Discharge Instructions (Addendum)
Patient to take medication as directed with food to completion.  Encouraged patient to increase daily water intake to 64 ounces per day while taking this medication.  Advised if symptoms worsen and/or unresolved please follow-up with PCP or urology, or or here for further evaluation.

## 2022-09-11 NOTE — ED Triage Notes (Signed)
Hx of UTI's   Frequency x 24 hours w/ pressure  AZO at 1200

## 2022-09-15 LAB — URINE CULTURE: Culture: >=100000 — AB

## 2023-03-15 DIAGNOSIS — J029 Acute pharyngitis, unspecified: Secondary | ICD-10-CM | POA: Diagnosis not present

## 2023-10-27 DIAGNOSIS — H5713 Ocular pain, bilateral: Secondary | ICD-10-CM | POA: Diagnosis not present

## 2023-11-26 DIAGNOSIS — R110 Nausea: Secondary | ICD-10-CM | POA: Diagnosis not present

## 2023-11-26 DIAGNOSIS — R52 Pain, unspecified: Secondary | ICD-10-CM | POA: Diagnosis not present

## 2023-12-25 DIAGNOSIS — R062 Wheezing: Secondary | ICD-10-CM | POA: Diagnosis not present

## 2023-12-25 DIAGNOSIS — Z20822 Contact with and (suspected) exposure to covid-19: Secondary | ICD-10-CM | POA: Diagnosis not present

## 2023-12-25 DIAGNOSIS — H6123 Impacted cerumen, bilateral: Secondary | ICD-10-CM | POA: Diagnosis not present

## 2023-12-25 DIAGNOSIS — R051 Acute cough: Secondary | ICD-10-CM | POA: Diagnosis not present

## 2024-03-14 DIAGNOSIS — Z13220 Encounter for screening for lipoid disorders: Secondary | ICD-10-CM | POA: Diagnosis not present

## 2024-03-14 DIAGNOSIS — H65192 Other acute nonsuppurative otitis media, left ear: Secondary | ICD-10-CM | POA: Diagnosis not present

## 2024-03-14 DIAGNOSIS — Z0000 Encounter for general adult medical examination without abnormal findings: Secondary | ICD-10-CM | POA: Diagnosis not present

## 2024-03-14 DIAGNOSIS — Z1329 Encounter for screening for other suspected endocrine disorder: Secondary | ICD-10-CM | POA: Diagnosis not present

## 2024-04-14 DIAGNOSIS — R946 Abnormal results of thyroid function studies: Secondary | ICD-10-CM | POA: Diagnosis not present

## 2024-05-07 DIAGNOSIS — R062 Wheezing: Secondary | ICD-10-CM | POA: Diagnosis not present

## 2024-05-07 DIAGNOSIS — J069 Acute upper respiratory infection, unspecified: Secondary | ICD-10-CM | POA: Diagnosis not present

## 2024-07-17 DIAGNOSIS — R002 Palpitations: Secondary | ICD-10-CM | POA: Diagnosis not present

## 2024-07-17 DIAGNOSIS — E038 Other specified hypothyroidism: Secondary | ICD-10-CM | POA: Diagnosis not present

## 2024-07-19 DIAGNOSIS — R002 Palpitations: Secondary | ICD-10-CM | POA: Diagnosis not present

## 2024-07-20 DIAGNOSIS — J020 Streptococcal pharyngitis: Secondary | ICD-10-CM | POA: Diagnosis not present

## 2024-07-20 DIAGNOSIS — R070 Pain in throat: Secondary | ICD-10-CM | POA: Diagnosis not present

## 2024-08-16 DIAGNOSIS — R002 Palpitations: Secondary | ICD-10-CM | POA: Diagnosis not present

## 2024-08-17 DIAGNOSIS — J029 Acute pharyngitis, unspecified: Secondary | ICD-10-CM | POA: Diagnosis not present

## 2024-09-15 DIAGNOSIS — R002 Palpitations: Secondary | ICD-10-CM | POA: Diagnosis not present
